# Patient Record
Sex: Female | Born: 2005 | Hispanic: Yes | Marital: Single | Smoking: Never smoker
Health system: Southern US, Community
[De-identification: ages and names within clinical notes are randomized; demographics above are authoritative.]

## PROBLEM LIST (undated history)

## (undated) DIAGNOSIS — F419 Anxiety disorder, unspecified: Secondary | ICD-10-CM

## (undated) DIAGNOSIS — F418 Other specified anxiety disorders: Secondary | ICD-10-CM

## (undated) DIAGNOSIS — J3089 Other allergic rhinitis: Secondary | ICD-10-CM

## (undated) DIAGNOSIS — E88819 Insulin resistance, unspecified: Secondary | ICD-10-CM

## (undated) HISTORY — DX: Insulin resistance, unspecified: E88.819

## (undated) HISTORY — DX: Other allergic rhinitis: J30.89

## (undated) HISTORY — DX: Anxiety disorder, unspecified: F41.9

## (undated) HISTORY — DX: Other specified anxiety disorders: F41.8

---
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
## 2024-01-04 NOTE — Telephone Encounter (Signed)
 Spoke with pt advised to pick up her DMV form and her Work excuse letter from the office. Pt state that she will come by the office. Forms placed in the front office filing cabinet

## 2024-01-04 NOTE — Telephone Encounter (Signed)
 I ordered the cardiac monitor. The DMV form is ready to pick up. Please get her the work note she requested

## 2024-01-06 DIAGNOSIS — F411 Generalized anxiety disorder: Secondary | ICD-10-CM | POA: Diagnosis not present

## 2024-01-06 DIAGNOSIS — F4011 Social phobia, generalized: Secondary | ICD-10-CM | POA: Diagnosis not present

## 2024-01-06 DIAGNOSIS — F341 Dysthymic disorder: Secondary | ICD-10-CM | POA: Diagnosis not present

## 2024-01-13 DIAGNOSIS — F4011 Social phobia, generalized: Secondary | ICD-10-CM | POA: Diagnosis not present

## 2024-01-13 DIAGNOSIS — F411 Generalized anxiety disorder: Secondary | ICD-10-CM | POA: Diagnosis not present

## 2024-01-13 DIAGNOSIS — F341 Dysthymic disorder: Secondary | ICD-10-CM | POA: Diagnosis not present

## 2024-01-15 ENCOUNTER — Encounter: Payer: Self-pay | Admitting: Family Medicine

## 2024-01-15 MED ORDER — ATOMOXETINE HCL 10 MG PO CAPS: 10.0000 mg | ORAL_CAPSULE | Freq: Every morning | ORAL | 2 refills | Status: DC

## 2024-01-15 NOTE — Telephone Encounter (Signed)
 I agree with trying a non-stimulant. I sent in 30 days of Strattera for her to try. We can increase the dose if needed

## 2024-01-25 DIAGNOSIS — F341 Dysthymic disorder: Secondary | ICD-10-CM | POA: Diagnosis not present

## 2024-01-25 DIAGNOSIS — F411 Generalized anxiety disorder: Secondary | ICD-10-CM | POA: Diagnosis not present

## 2024-01-25 DIAGNOSIS — F4011 Social phobia, generalized: Secondary | ICD-10-CM | POA: Diagnosis not present

## 2024-01-27 ENCOUNTER — Other Ambulatory Visit: Payer: Self-pay | Admitting: Family Medicine

## 2024-02-03 DIAGNOSIS — F411 Generalized anxiety disorder: Secondary | ICD-10-CM | POA: Diagnosis not present

## 2024-02-03 DIAGNOSIS — F4011 Social phobia, generalized: Secondary | ICD-10-CM | POA: Diagnosis not present

## 2024-02-03 DIAGNOSIS — F341 Dysthymic disorder: Secondary | ICD-10-CM | POA: Diagnosis not present

## 2024-02-07 ENCOUNTER — Other Ambulatory Visit: Payer: Self-pay | Admitting: Family Medicine

## 2024-02-10 DIAGNOSIS — F411 Generalized anxiety disorder: Secondary | ICD-10-CM | POA: Diagnosis not present

## 2024-02-10 DIAGNOSIS — F4011 Social phobia, generalized: Secondary | ICD-10-CM | POA: Diagnosis not present

## 2024-02-10 DIAGNOSIS — F341 Dysthymic disorder: Secondary | ICD-10-CM | POA: Diagnosis not present

## 2024-02-16 ENCOUNTER — Encounter: Payer: Self-pay | Admitting: Family Medicine

## 2024-02-16 DIAGNOSIS — R002 Palpitations: Secondary | ICD-10-CM

## 2024-02-17 ENCOUNTER — Ambulatory Visit: Attending: Family Medicine

## 2024-02-17 DIAGNOSIS — F411 Generalized anxiety disorder: Secondary | ICD-10-CM | POA: Diagnosis not present

## 2024-02-17 DIAGNOSIS — R002 Palpitations: Secondary | ICD-10-CM

## 2024-02-17 DIAGNOSIS — F4011 Social phobia, generalized: Secondary | ICD-10-CM | POA: Diagnosis not present

## 2024-02-17 DIAGNOSIS — F341 Dysthymic disorder: Secondary | ICD-10-CM | POA: Diagnosis not present

## 2024-02-17 NOTE — Progress Notes (Unsigned)
 EP to read.

## 2024-02-17 NOTE — Telephone Encounter (Signed)
 Pt notes do not show order for a Heart Monitor, please advise

## 2024-02-17 NOTE — Telephone Encounter (Signed)
(  1) I reordered a 14 day monitor called a Zio patch, (2) [facility] [address]. She can call them at [phone], and (3) she can take 3 or 4 Strattera  tablets each morning to see if 30 mg or 40 mg works better for her

## 2024-02-19 ENCOUNTER — Ambulatory Visit: Admitting: Plastic Surgery

## 2024-02-19 ENCOUNTER — Encounter: Payer: Self-pay | Admitting: Plastic Surgery

## 2024-02-19 VITALS — BP 124/82 | HR 103

## 2024-02-19 DIAGNOSIS — L910 Hypertrophic scar: Secondary | ICD-10-CM | POA: Diagnosis not present

## 2024-02-19 NOTE — Progress Notes (Signed)
 Procedure Note  Preoperative Dx: keloid of right ear  Postoperative Dx: Same  Procedure: excision of ear keloid 2 cm  Anesthesia: Lidocaine 1% with 1:100,000 epinephrine   Indication for Procedure: keloid  Description of Procedure: Risks and complications were explained to the patient.  Consent was confirmed and the patient understands the risks and benefits.  The potential complications and alternatives were explained and the patient consents.  The patient expressed understanding the option of not having the procedure and the risks of a scar.  Time out was called and all information was confirmed to be correct.    The area was prepped and drapped.  Lidocaine 1% with epinephrine  was injected in the subcutaneous area.  After waiting several minutes for the local to take affect a #15 blade was used to excise the area.  The skin edges were reapproximated with 6-0 Monocryl.  A dressing was applied.  The patient was given instructions on how to care for the area and a follow up appointment.  Anwitha tolerated the procedure well and there were no complications. The specimen was sent to pathology.

## 2024-02-24 ENCOUNTER — Ambulatory Visit: Admitting: Surgical

## 2024-02-24 VITALS — BP 127/77 | HR 96

## 2024-02-24 DIAGNOSIS — L910 Hypertrophic scar: Secondary | ICD-10-CM

## 2024-02-24 NOTE — Progress Notes (Signed)
 Patient is a 18 year old female here for follow-up after excision of right ear keloid with Dr. Lowery.  Procedure was 02/19/2024.  She is 5 days postop.  She is doing well.  She is not having any issues.  6-0 Monocryl sutures were used.  On exam right ear incision is intact and healing well.  Steri-Strip was removed.  Incision is intact and healing well.  There is some Monocryl suture knots noted.  There is no erythema.  No tenderness noted with palpation.  A/P:  Suture knots were removed, patient tolerated this well.  Discussed importance of gentle scar massage.  Discussed with patient she can keep the area covered with Band-Aid if she would like or okay to keep uncovered as well.  Discussed possible recurrence of keloids as possible.  Discussed follow-up as needed, call with questions or concerns.  No signs of infection or concern on exam.

## 2024-02-29 DIAGNOSIS — F411 Generalized anxiety disorder: Secondary | ICD-10-CM | POA: Diagnosis not present

## 2024-02-29 DIAGNOSIS — F4011 Social phobia, generalized: Secondary | ICD-10-CM | POA: Diagnosis not present

## 2024-02-29 DIAGNOSIS — F341 Dysthymic disorder: Secondary | ICD-10-CM | POA: Diagnosis not present

## 2024-03-03 ENCOUNTER — Telehealth: Payer: Self-pay | Admitting: Plastic Surgery

## 2024-03-03 NOTE — Telephone Encounter (Signed)
 provider out of office called and phone just rings10-2-25 to r/s

## 2024-03-16 DIAGNOSIS — F341 Dysthymic disorder: Secondary | ICD-10-CM | POA: Diagnosis not present

## 2024-03-16 DIAGNOSIS — F411 Generalized anxiety disorder: Secondary | ICD-10-CM | POA: Diagnosis not present

## 2024-03-16 DIAGNOSIS — F4011 Social phobia, generalized: Secondary | ICD-10-CM | POA: Diagnosis not present

## 2024-03-19 ENCOUNTER — Encounter: Payer: Self-pay | Admitting: Family Medicine

## 2024-03-21 MED ORDER — ATOMOXETINE HCL 40 MG PO CAPS: 40.0000 mg | ORAL_CAPSULE | Freq: Every day | ORAL | 5 refills | Status: DC

## 2024-03-21 NOTE — Telephone Encounter (Signed)
 I sent in for the 40 mg capsules so she can take just one pill a day

## 2024-03-22 ENCOUNTER — Ambulatory Visit: Admitting: Plastic Surgery

## 2024-03-23 DIAGNOSIS — F411 Generalized anxiety disorder: Secondary | ICD-10-CM | POA: Diagnosis not present

## 2024-03-23 DIAGNOSIS — F4011 Social phobia, generalized: Secondary | ICD-10-CM | POA: Diagnosis not present

## 2024-03-23 DIAGNOSIS — F341 Dysthymic disorder: Secondary | ICD-10-CM | POA: Diagnosis not present

## 2024-03-23 DIAGNOSIS — R002 Palpitations: Secondary | ICD-10-CM | POA: Diagnosis not present

## 2024-03-29 ENCOUNTER — Ambulatory Visit: Admitting: Plastic Surgery

## 2024-03-29 VITALS — BP 94/64 | HR 108

## 2024-03-29 DIAGNOSIS — L910 Hypertrophic scar: Secondary | ICD-10-CM

## 2024-03-29 NOTE — Progress Notes (Signed)
 Procedure Note  Preoperative Dx: keloid of right ear  Postoperative Dx: Same  Procedure: Kenalog injection to right ear keloid excision 1 cm   Description of Procedure: Risks and complications were explained to the patient.  Consent was confirmed and the patient understands the risks and benefits.  The potential complications and alternatives were explained and the patient consents.  The patient expressed understanding the option of not having the procedure and the risks of a scar.  Time out was called and all information was confirmed to be correct.    The area was prepped and drapped.  Kenalog 50/5 mg 0.1 cc was injected into the right helix to help reduce the risk of recurrent keloid.  A dressing was applied.  The patient was given instructions on how to care for the area and a follow up appointment.  Abril tolerated the procedure well and there were no complications.

## 2024-03-30 DIAGNOSIS — F4011 Social phobia, generalized: Secondary | ICD-10-CM | POA: Diagnosis not present

## 2024-03-30 DIAGNOSIS — F411 Generalized anxiety disorder: Secondary | ICD-10-CM | POA: Diagnosis not present

## 2024-03-30 DIAGNOSIS — R002 Palpitations: Secondary | ICD-10-CM | POA: Diagnosis not present

## 2024-03-30 DIAGNOSIS — F341 Dysthymic disorder: Secondary | ICD-10-CM | POA: Diagnosis not present

## 2024-03-31 ENCOUNTER — Ambulatory Visit: Payer: Self-pay | Admitting: Family Medicine

## 2024-04-06 DIAGNOSIS — F341 Dysthymic disorder: Secondary | ICD-10-CM | POA: Diagnosis not present

## 2024-04-06 DIAGNOSIS — F4011 Social phobia, generalized: Secondary | ICD-10-CM | POA: Diagnosis not present

## 2024-04-06 DIAGNOSIS — F411 Generalized anxiety disorder: Secondary | ICD-10-CM | POA: Diagnosis not present

## 2024-04-12 ENCOUNTER — Other Ambulatory Visit: Payer: Self-pay | Admitting: Family Medicine

## 2024-04-13 DIAGNOSIS — F341 Dysthymic disorder: Secondary | ICD-10-CM | POA: Diagnosis not present

## 2024-04-13 DIAGNOSIS — F411 Generalized anxiety disorder: Secondary | ICD-10-CM | POA: Diagnosis not present

## 2024-04-13 DIAGNOSIS — F4011 Social phobia, generalized: Secondary | ICD-10-CM | POA: Diagnosis not present

## 2024-04-20 DIAGNOSIS — F411 Generalized anxiety disorder: Secondary | ICD-10-CM | POA: Diagnosis not present

## 2024-04-20 DIAGNOSIS — F341 Dysthymic disorder: Secondary | ICD-10-CM | POA: Diagnosis not present

## 2024-04-20 DIAGNOSIS — F4011 Social phobia, generalized: Secondary | ICD-10-CM | POA: Diagnosis not present

## 2024-04-27 DIAGNOSIS — F4011 Social phobia, generalized: Secondary | ICD-10-CM | POA: Diagnosis not present

## 2024-04-27 DIAGNOSIS — F341 Dysthymic disorder: Secondary | ICD-10-CM | POA: Diagnosis not present

## 2024-04-27 DIAGNOSIS — F411 Generalized anxiety disorder: Secondary | ICD-10-CM | POA: Diagnosis not present

## 2024-05-03 ENCOUNTER — Ambulatory Visit: Admitting: Plastic Surgery

## 2024-05-04 DIAGNOSIS — F341 Dysthymic disorder: Secondary | ICD-10-CM | POA: Diagnosis not present

## 2024-05-04 DIAGNOSIS — F4011 Social phobia, generalized: Secondary | ICD-10-CM | POA: Diagnosis not present

## 2024-05-04 DIAGNOSIS — F411 Generalized anxiety disorder: Secondary | ICD-10-CM | POA: Diagnosis not present

## 2024-05-11 DIAGNOSIS — N898 Other specified noninflammatory disorders of vagina: Secondary | ICD-10-CM | POA: Diagnosis not present

## 2024-05-13 DIAGNOSIS — F4011 Social phobia, generalized: Secondary | ICD-10-CM | POA: Diagnosis not present

## 2024-05-13 DIAGNOSIS — F341 Dysthymic disorder: Secondary | ICD-10-CM | POA: Diagnosis not present

## 2024-05-13 DIAGNOSIS — F411 Generalized anxiety disorder: Secondary | ICD-10-CM | POA: Diagnosis not present

## 2024-05-16 ENCOUNTER — Encounter: Payer: Self-pay | Admitting: Cardiovascular Disease

## 2024-05-16 ENCOUNTER — Ambulatory Visit: Attending: Cardiovascular Disease | Admitting: Cardiovascular Disease

## 2024-05-16 VITALS — BP 122/78 | HR 100 | Ht 67.0 in | Wt 260.0 lb

## 2024-05-16 DIAGNOSIS — R002 Palpitations: Secondary | ICD-10-CM | POA: Insufficient documentation

## 2024-05-16 DIAGNOSIS — E782 Mixed hyperlipidemia: Secondary | ICD-10-CM

## 2024-05-16 DIAGNOSIS — R0609 Other forms of dyspnea: Secondary | ICD-10-CM | POA: Diagnosis not present

## 2024-05-16 DIAGNOSIS — R0789 Other chest pain: Secondary | ICD-10-CM | POA: Diagnosis not present

## 2024-05-16 DIAGNOSIS — E785 Hyperlipidemia, unspecified: Secondary | ICD-10-CM | POA: Insufficient documentation

## 2024-05-16 NOTE — Progress Notes (Signed)
 05/16/2024 Christy Huber   August 30, 2005  [id number]  Primary Physician Johnny Garnette LABOR, MD Primary Cardiologist: Dorn JINNY Lesches MD [facility] [place], [place], [place]  HPI:  Christy Huber is a 18 y.o. severely overweight single Caucasian female with no children referred by the ER because of atypical chest pain and palpitations.  She works as a conservation officer, nature at [employer].  Her only risk factors are untreated mild hyperlipidemia.  She quit smoking 6/25 after smoking for 2 years.  There is no family history of heart disease.  She is never had a heart attack or stroke.  She does complain of some atypical chest pain which is which does not sound cardiac in some dyspnea on exertion.  She also was complaining of palpitations and had a 2-week event monitor 02/17/2024 which was entirely normal.  She does have ADHD and anxiety depression as well on medications.   Active Medications[1]   Allergies[2]  Social History   Socioeconomic History   Marital status: Single    Spouse name: Not on file   Number of children: Not on file   Years of education: Not on file   Highest education level: Some college, no degree  Occupational History   Not on file  Tobacco Use   Smoking status: Never   Smokeless tobacco: Never  Vaping Use   Vaping status: Never Used  Substance and Sexual Activity   Alcohol use: No   Drug use: No   Sexual activity: Not Currently  Other Topics Concern   Not on file  Social History Narrative      Attends [employer] is in 7th grade   Lives with mom. After school care with maternal grandmother.   Social Drivers of Health   Tobacco Use: Low Risk (05/16/2024)   Patient History    Smoking Tobacco Use: Never    Smokeless Tobacco Use: Never    Passive Exposure: Not on file  Financial Resource Strain: Low Risk (11/16/2023)   Overall Financial Resource Strain (CARDIA)    Difficulty of Paying Living Expenses: Not very hard  Food Insecurity:  No Food Insecurity (11/16/2023)   Epic    Worried About Programme Researcher, Broadcasting/film/video in the Last Year: Never true    Ran Out of Food in the Last Year: Never true  Transportation Needs: No Transportation Needs (11/16/2023)   Epic    Lack of Transportation (Medical): No    Lack of Transportation (Non-Medical): No  Physical Activity: Insufficiently Active (11/16/2023)   Exercise Vital Sign    Days of Exercise per Week: 1 day    Minutes of Exercise per Session: 10 min  Stress: Stress Concern Present (11/16/2023)   [employer] of Occupational Health - Occupational Stress Questionnaire    Feeling of Stress: Rather much  Social Connections: Socially Isolated (11/16/2023)   Social Connection and Isolation Panel    Frequency of Communication with Friends and Family: Three times a week    Frequency of Social Gatherings with Friends and Family: Once a week    Attends Religious Services: Never    Database Administrator or Organizations: No    Attends Banker Meetings: Not on file    Marital Status: Never married  Intimate Partner Violence: Not on file  Depression (PHQ2-9): High Risk (12/28/2023)   Depression (PHQ2-9)    PHQ-2 Score: 16  Alcohol Screen: Not on file  Housing: Unknown (11/16/2023)   Epic  Unable to Pay for Housing in the Last Year: No    Number of Times Moved in the Last Year: Not on file    Homeless in the Last Year: No  Utilities: Not on file  Health Literacy: Not on file     Review of Systems: General: negative for chills, fever, night sweats or weight changes.  Cardiovascular: negative for chest pain, dyspnea on exertion, edema, orthopnea, palpitations, paroxysmal nocturnal dyspnea or shortness of breath Dermatological: negative for rash Respiratory: negative for cough or wheezing Urologic: negative for hematuria Abdominal: negative for nausea, vomiting, diarrhea, bright red blood per rectum, melena, or hematemesis Neurologic: negative for visual changes,  syncope, or dizziness All other systems reviewed and are otherwise negative except as noted above.    Blood pressure 122/78, pulse 100, height 5' 7 (1.702 m), weight 260 lb (117.9 kg), SpO2 98%.  General appearance: alert and no distress Neck: no adenopathy, no carotid bruit, no JVD, supple, symmetrical, trachea midline, and thyroid not enlarged, symmetric, no tenderness/mass/nodules Lungs: clear to auscultation bilaterally Heart: regular rate and rhythm, S1, S2 normal, no murmur, click, rub or gallop Extremities: extremities normal, atraumatic, no cyanosis or edema Pulses: 2+ and symmetric Skin: Skin color, texture, turgor normal. No rashes or lesions Neurologic: Grossly normal  EKG not performed today      ASSESSMENT AND PLAN:   Hyperlipidemia Her most recent lab work performed 01/05/2023 revealed total cholesterol 231, LDL of 122 and HDL of 82.  Atypical chest pain Most likely noncardiac.  Young with no risk factors.  I am going get a coronary calcium score to restratify  Dyspnea on exertion Probably multifactorial.  She is on inhaled bronchodilator.  I do hear late systolic click with question of mitral valve prolapse.  I am going to get a 2D echo to further evaluate.  Palpitations 2-week event monitor showed no arrhythmias     Dorn DOROTHA Lesches MD [facility], FSCAI 05/16/2024 11:15 AM    [1]  Current Meds  Medication Sig   albuterol (PROVENTIL HFA;VENTOLIN HFA) 108 (90 Base) MCG/ACT inhaler Inhale into the lungs every 6 (six) hours as needed for wheezing or shortness of breath.   atomoxetine  (STRATTERA ) 40 MG capsule TAKE 1 CAPSULE BY MOUTH DAILY   buPROPion  (WELLBUTRIN  XL) 300 MG 24 hr tablet Take 1 tablet (300 mg total) by mouth daily.   EPINEPHrine  0.3 mg/0.3 mL IJ SOAJ injection Inject 0.3 mg into the muscle as needed for anaphylaxis.   IUD'S IU by Intrauterine route. 5-8 years   LORazepam  (ATIVAN ) 0.5 MG tablet Take 1 tablet (0.5 mg total) by mouth 2 (two)  times daily as needed for anxiety.   Multiple Vitamin (MULTIVITAMIN) tablet Take by mouth.   venlafaxine  XR (EFFEXOR -XR) 150 MG 24 hr capsule TAKE 1 CAPSULE BY MOUTH DAILY WITH BREAKFAST.  [2] No Known Allergies

## 2024-05-16 NOTE — Patient Instructions (Signed)
 Medication Instructions:  Your physician recommends that you continue on your current medications as directed. Please refer to the Current Medication list given to you today.  *If you need a refill on your cardiac medications before your next appointment, please call your pharmacy*   Testing/Procedures: Your physician has requested that you have an echocardiogram. Echocardiography is a painless test that uses sound waves to create images of your heart. It provides your doctor with information about the size and shape of your heart and how well your heart's chambers and valves are working. This procedure takes approximately one hour. There are no restrictions for this procedure. Please do NOT wear cologne, perfume, aftershave, or lotions (deodorant is allowed). Please arrive 15 minutes prior to your appointment time.  Please note: We ask at that you not bring children with you during ultrasound (echo/ vascular) testing. Due to room size and safety concerns, children are not allowed in the ultrasound rooms during exams. Our front office staff cannot provide observation of children in our lobby area while testing is being conducted. An adult accompanying a patient to their appointment will only be allowed in the ultrasound room at the discretion of the ultrasound technician under special circumstances. We apologize for any inconvenience.   Dr. Court has ordered a CT coronary calcium score.   Test locations:  [facility] at [facility] at [facility]  This is $99 out of pocket.   Coronary CalciumScan A coronary calcium scan is an imaging test used to look for deposits of calcium and other fatty materials (plaques) in the inner lining of the blood vessels of the heart (coronary arteries). These deposits of calcium and plaques can partly clog and narrow the coronary arteries without  producing any symptoms or warning signs. This puts a person at risk for a heart attack. This test can detect these deposits before symptoms develop. Tell a health care provider about: Any allergies you have. All medicines you are taking, including vitamins, herbs, eye drops, creams, and over-the-counter medicines. Any problems you or family members have had with anesthetic medicines. Any blood disorders you have. Any surgeries you have had. Any medical conditions you have. Whether you are pregnant or may be pregnant. What are the risks? Generally, this is a safe procedure. However, problems may occur, including: Harm to a pregnant woman and her unborn baby. This test involves the use of radiation. Radiation exposure can be dangerous to a pregnant woman and her unborn baby. If you are pregnant, you generally should not have this procedure done. Slight increase in the risk of cancer. This is because of the radiation involved in the test. What happens before the procedure? No preparation is needed for this procedure. What happens during the procedure? You will undress and remove any jewelry around your neck or chest. You will put on a hospital gown. Sticky electrodes will be placed on your chest. The electrodes will be connected to an electrocardiogram (ECG) machine to record a tracing of the electrical activity of your heart. A CT scanner will take pictures of your heart. During this time, you will be asked to lie still and hold your breath for 2-3 seconds while a picture of your heart is being taken. The procedure may vary among health care providers and hospitals. What happens after the procedure? You can get dressed. You can return to your normal activities. It is up to you to  get the results of your test. Ask your health care provider, or the department that is doing the test, when your results will be ready. Summary A coronary calcium scan is an imaging test used to look for deposits of  calcium and other fatty materials (plaques) in the inner lining of the blood vessels of the heart (coronary arteries). Generally, this is a safe procedure. Tell your health care provider if you are pregnant or may be pregnant. No preparation is needed for this procedure. A CT scanner will take pictures of your heart. You can return to your normal activities after the scan is done. This information is not intended to replace advice given to you by your health care provider. Make sure you discuss any questions you have with your health care provider. Document Released: 11/15/2007 Document Revised: 04/07/2016 Document Reviewed: 04/07/2016 Elsevier Interactive Patient Education  2017 [employer].   Follow-Up: At [facility], you and your health needs are our priority.  As part of our continuing mission to provide you with exceptional heart care, our providers are all part of one team.  This team includes your primary Cardiologist (physician) and Advanced Practice Providers or APPs (Physician Assistants and Nurse Practitioners) who all work together to provide you with the care you need, when you need it.  Your next appointment:   We will see you on an as needed basis  Provider:   Dorn Lesches, MD

## 2024-05-16 NOTE — Assessment & Plan Note (Signed)
 2-week event monitor showed no arrhythmias

## 2024-05-16 NOTE — Assessment & Plan Note (Signed)
 Her most recent lab work performed 01/05/2023 revealed total cholesterol 231, LDL of 122 and HDL of 82.

## 2024-05-16 NOTE — Assessment & Plan Note (Signed)
 Most likely noncardiac.  Young with no risk factors.  I am going get a coronary calcium score to restratify

## 2024-05-16 NOTE — Assessment & Plan Note (Signed)
 Probably multifactorial.  She is on inhaled bronchodilator.  I do hear late systolic click with question of mitral valve prolapse.  I am going to get a 2D echo to further evaluate.

## 2024-05-18 DIAGNOSIS — F341 Dysthymic disorder: Secondary | ICD-10-CM | POA: Diagnosis not present

## 2024-05-18 DIAGNOSIS — F4011 Social phobia, generalized: Secondary | ICD-10-CM | POA: Diagnosis not present

## 2024-05-18 DIAGNOSIS — F411 Generalized anxiety disorder: Secondary | ICD-10-CM | POA: Diagnosis not present

## 2024-05-19 ENCOUNTER — Ambulatory Visit (INDEPENDENT_AMBULATORY_CARE_PROVIDER_SITE_OTHER): Admitting: Psychiatry

## 2024-05-19 VITALS — BP 139/69 | HR 92 | Ht 69.0 in | Wt 283.0 lb

## 2024-05-19 DIAGNOSIS — F902 Attention-deficit hyperactivity disorder, combined type: Secondary | ICD-10-CM | POA: Diagnosis not present

## 2024-05-19 DIAGNOSIS — F4310 Post-traumatic stress disorder, unspecified: Secondary | ICD-10-CM

## 2024-05-19 DIAGNOSIS — F411 Generalized anxiety disorder: Secondary | ICD-10-CM | POA: Diagnosis not present

## 2024-05-19 MED ORDER — VENLAFAXINE HCL ER 37.5 MG PO CP24: ORAL_CAPSULE | ORAL | 3 refills | Status: DC

## 2024-05-19 MED ORDER — ATOMOXETINE HCL 80 MG PO CAPS: 80.0000 mg | ORAL_CAPSULE | Freq: Every day | ORAL | 1 refills | Status: AC
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
## 2024-05-27 DIAGNOSIS — F341 Dysthymic disorder: Secondary | ICD-10-CM | POA: Diagnosis not present

## 2024-05-27 DIAGNOSIS — F411 Generalized anxiety disorder: Secondary | ICD-10-CM | POA: Diagnosis not present

## 2024-05-27 DIAGNOSIS — F4011 Social phobia, generalized: Secondary | ICD-10-CM | POA: Diagnosis not present

## 2024-06-15 ENCOUNTER — Other Ambulatory Visit: Payer: Self-pay | Admitting: Psychiatry

## 2024-06-28 ENCOUNTER — Ambulatory Visit (HOSPITAL_COMMUNITY)

## 2024-06-28 ENCOUNTER — Telehealth (HOSPITAL_COMMUNITY): Payer: Self-pay | Admitting: Cardiovascular Disease

## 2024-06-28 NOTE — Telephone Encounter (Signed)
 Patient cancelled echocardiogram per Automated system. We will not reach out to reschedule due to high NO SHOW RATE of 18%. If patient calls to reschedule we will reinstate the order. Thank you.

## 2024-07-01 ENCOUNTER — Encounter: Payer: Self-pay | Admitting: Psychiatry

## 2024-07-01 ENCOUNTER — Ambulatory Visit (INDEPENDENT_AMBULATORY_CARE_PROVIDER_SITE_OTHER): Admitting: Psychiatry

## 2024-07-01 DIAGNOSIS — F902 Attention-deficit hyperactivity disorder, combined type: Secondary | ICD-10-CM | POA: Diagnosis not present

## 2024-07-01 DIAGNOSIS — F4310 Post-traumatic stress disorder, unspecified: Secondary | ICD-10-CM | POA: Diagnosis not present

## 2024-07-01 DIAGNOSIS — F411 Generalized anxiety disorder: Secondary | ICD-10-CM | POA: Diagnosis not present

## 2024-07-01 MED ORDER — VENLAFAXINE HCL ER 37.5 MG PO CP24: ORAL_CAPSULE | ORAL | 0 refills | Status: AC

## 2024-07-01 MED ORDER — ATOMOXETINE HCL 100 MG PO CAPS: 100.0000 mg | ORAL_CAPSULE | Freq: Every day | ORAL | 1 refills | Status: AC

## 2024-07-01 NOTE — Progress Notes (Signed)
 "  [facility] [address], [place]   Follow-up visit  Date of Service: 07/01/2024  CC/Purpose: Routine medication management follow up.    Christy Huber is a 19 y.o. female with a past psychiatric history of ADHD, anxiety who presents today for a psychiatric follow up appointment.   The patient was last seen on 05/19/24, at which time the following plan was established:  Medication management:             - Continue Wellbutrin  XL 300mg  daily             - Increase Strattera  to 80mg  daily             - Increase Effexor  to 187.5mg  daily _______________________________________________________________________________________ Acute events/encounters since last visit: denies    Doniesha reports that things are going okay. She has been going to school and working. She has noticed her focus is better on the higher dose of Strattera . She likes this medicine and wants to try a higher dose. She has been taking the higher Effexor  dose as well. She feels this is too high, and is interested in tapering this. She denies any sI/hi/AVH.    Sleep: stable Appetite: Stable Depression: denies Bipolar symptoms:  denies Current suicidal/homicidal ideations:  denied Current auditory/visual hallucinations:  denied    Non-Suicidal Self-Injury: has cut previously - wrists - last a few months ago Suicide Attempt History: denies  Psychotherapy: [facility]rent[facility] Lesser Previous psychiatric medication trials:  Zoloft , Adderall    Goes to [employer] Works as conservation officer, nature at [employer] Current Living Situation (including members of house hold): lives with mom     Allergies[1]    Labs:  reviewed  Medical diagnoses: Patient Active Problem List   Diagnosis Date Noted   Hyperlipidemia 05/16/2024   Atypical chest pain 05/16/2024   Dyspnea on exertion 05/16/2024   Palpitations 05/16/2024   ADHD (attention deficit hyperactivity disorder), inattentive type 12/15/2023    Marijuana abuse 11/16/2023   Keloid 04/21/2023   Anxiety with depression 12/02/2022   Environmental and seasonal allergies 12/02/2022   Fatigue 08/03/2018   Family history of celiac disease 08/03/2018   Abdominal pain 08/03/2018   Insulin resistance 04/22/2017   Childhood obesity, BMI 95-100 percentile 04/25/2013   Acanthosis 04/25/2013    Psychiatric Specialty Exam: There were no vitals taken for this visit.There is no height or weight on file to calculate BMI.  General Appearance: Guarded, Neat, and Well Groomed  Eye Contact:  Good  Speech:  Clear and Coherent  Mood:  Anxious  Affect:  Appropriate  Thought Process:  Goal Directed  Orientation:  Full (Time, Place, and Person)  Thought Content:  Logical  Suicidal Thoughts:  No  Homicidal Thoughts:  No  Memory:  Immediate;   Good  Judgement:  Good  Insight:  Good  Psychomotor Activity:  Normal  Concentration:  Concentration: Good  Recall:  Good  Fund of Knowledge:  Good  Language:  Good  Assets:  Communication Skills Desire for Improvement Financial Resources/Insurance Housing Leisure Time Physical Health Resilience Social Support Talents/Skills Transportation Vocational/Educational  Cognition:  WNL      Assessment   Psychiatric Diagnoses:   ICD-10-CM   1. Posttraumatic stress disorder  F43.10     2. Attention deficit hyperactivity disorder (ADHD), combined type, moderate  F90.2     3. Generalized anxiety disorder  F41.1       Patient complexity: Moderate   Patient Education and Counseling:  Supportive therapy provided for identified psychosocial stressors.  Medication education provided and decisions regarding medication regimen discussed with patient/guardian.   On assessment today, Ariyel has had a positive response to Strattera  overall. Her ADHD symptoms are improved, so we will increase this further. She endorses a worse mood with higher Effexor  doses, so we will begin to taper this slowly. No  SI/HI/Avh.    Plan  Medication management:  - Decrease Effexor  XR to 150mg  daily for two weeks then decrease to 112.5mg  daily  - Increase Strattera  to 100mg  daily  - Continue Wellbutrin  XL 300mg  daily  Labs/Studies:  - reviewed  Additional recommendations:  - Crisis plan reviewed and patient verbally contracts for safety. Go to ED with emergent symptoms or safety concerns and Risks, benefits, side effects of medications, including any / all black box warnings, discussed with patient, who verbalizes their understanding   Follow Up: Return in 1.5 months - Call in the interim for any side-effects, decompensation, questions, or problems between now and the next visit.   I have spent 25 minutes reviewing the patients chart, meeting with the patient and family, and reviewing medicines and side effects.   Selinda GORMAN Lauth, MD [facility]        [1] No Known Allergies  "

## 2024-08-19 ENCOUNTER — Ambulatory Visit: Admitting: Psychiatry
